# Patient Record
Sex: Female | Born: 1955 | Race: White | Hispanic: No | Marital: Married | Smoking: Never smoker
Health system: Southern US, Community
[De-identification: ages and names within clinical notes are randomized; demographics above are authoritative.]

## PROBLEM LIST (undated history)

## (undated) DIAGNOSIS — E785 Hyperlipidemia, unspecified: Secondary | ICD-10-CM

## (undated) DIAGNOSIS — R112 Nausea with vomiting, unspecified: Secondary | ICD-10-CM

## (undated) DIAGNOSIS — K219 Gastro-esophageal reflux disease without esophagitis: Secondary | ICD-10-CM

## (undated) DIAGNOSIS — N189 Chronic kidney disease, unspecified: Secondary | ICD-10-CM

## (undated) DIAGNOSIS — Z9889 Other specified postprocedural states: Secondary | ICD-10-CM

## (undated) DIAGNOSIS — F419 Anxiety disorder, unspecified: Secondary | ICD-10-CM

## (undated) DIAGNOSIS — K589 Irritable bowel syndrome without diarrhea: Secondary | ICD-10-CM

## (undated) DIAGNOSIS — K602 Anal fissure, unspecified: Secondary | ICD-10-CM

## (undated) HISTORY — DX: Irritable bowel syndrome without diarrhea: K58.9

## (undated) HISTORY — DX: Gastro-esophageal reflux disease without esophagitis: K21.9

## (undated) HISTORY — PX: LITHOTRIPSY: SUR834

## (undated) HISTORY — DX: Anal fissure, unspecified: K60.2

## (undated) HISTORY — DX: Hyperlipidemia, unspecified: E78.5

---

## 1997-04-09 ENCOUNTER — Ambulatory Visit (HOSPITAL_COMMUNITY): Admission: RE | Admit: 1997-04-09 | Discharge: 1997-04-09 | Payer: Self-pay | Admitting: Obstetrics & Gynecology

## 1998-05-07 ENCOUNTER — Encounter: Payer: Self-pay | Admitting: Obstetrics & Gynecology

## 1998-05-07 ENCOUNTER — Ambulatory Visit (HOSPITAL_COMMUNITY): Admission: RE | Admit: 1998-05-07 | Discharge: 1998-05-07 | Payer: Self-pay | Admitting: Obstetrics & Gynecology

## 1998-10-21 ENCOUNTER — Encounter: Payer: Self-pay | Admitting: Urology

## 1998-10-21 ENCOUNTER — Ambulatory Visit (HOSPITAL_COMMUNITY): Admission: RE | Admit: 1998-10-21 | Discharge: 1998-10-21 | Payer: Self-pay | Admitting: Urology

## 1999-05-12 ENCOUNTER — Encounter: Payer: Self-pay | Admitting: Obstetrics & Gynecology

## 1999-05-12 ENCOUNTER — Encounter: Admission: RE | Admit: 1999-05-12 | Discharge: 1999-05-12 | Payer: Self-pay | Admitting: Obstetrics & Gynecology

## 1999-08-20 ENCOUNTER — Other Ambulatory Visit: Admission: RE | Admit: 1999-08-20 | Discharge: 1999-08-20 | Payer: Self-pay | Admitting: Obstetrics & Gynecology

## 2000-05-17 ENCOUNTER — Encounter: Payer: Self-pay | Admitting: Obstetrics & Gynecology

## 2000-05-17 ENCOUNTER — Encounter: Admission: RE | Admit: 2000-05-17 | Discharge: 2000-05-17 | Payer: Self-pay | Admitting: Obstetrics & Gynecology

## 2001-05-23 ENCOUNTER — Encounter: Payer: Self-pay | Admitting: Obstetrics & Gynecology

## 2001-05-23 ENCOUNTER — Encounter: Admission: RE | Admit: 2001-05-23 | Discharge: 2001-05-23 | Payer: Self-pay | Admitting: Obstetrics & Gynecology

## 2001-05-30 ENCOUNTER — Encounter: Admission: RE | Admit: 2001-05-30 | Discharge: 2001-05-30 | Payer: Self-pay | Admitting: Obstetrics & Gynecology

## 2001-05-30 ENCOUNTER — Encounter: Payer: Self-pay | Admitting: Obstetrics & Gynecology

## 2002-06-07 ENCOUNTER — Other Ambulatory Visit: Admission: RE | Admit: 2002-06-07 | Discharge: 2002-06-07 | Payer: Self-pay | Admitting: Obstetrics & Gynecology

## 2002-06-14 ENCOUNTER — Encounter: Payer: Self-pay | Admitting: Obstetrics & Gynecology

## 2002-06-14 ENCOUNTER — Encounter: Admission: RE | Admit: 2002-06-14 | Discharge: 2002-06-14 | Payer: Self-pay | Admitting: Obstetrics & Gynecology

## 2003-01-27 HISTORY — PX: ABDOMINAL HYSTERECTOMY: SHX81

## 2003-03-28 ENCOUNTER — Ambulatory Visit (HOSPITAL_COMMUNITY): Admission: RE | Admit: 2003-03-28 | Discharge: 2003-03-28 | Payer: Self-pay | Admitting: Obstetrics & Gynecology

## 2003-05-07 ENCOUNTER — Ambulatory Visit (HOSPITAL_COMMUNITY): Admission: RE | Admit: 2003-05-07 | Discharge: 2003-05-07 | Payer: Self-pay | Admitting: Obstetrics & Gynecology

## 2003-05-29 ENCOUNTER — Encounter (INDEPENDENT_AMBULATORY_CARE_PROVIDER_SITE_OTHER): Payer: Self-pay | Admitting: Specialist

## 2003-05-29 ENCOUNTER — Inpatient Hospital Stay (HOSPITAL_COMMUNITY): Admission: RE | Admit: 2003-05-29 | Discharge: 2003-05-31 | Payer: Self-pay | Admitting: Obstetrics & Gynecology

## 2003-07-12 ENCOUNTER — Encounter: Admission: RE | Admit: 2003-07-12 | Discharge: 2003-07-12 | Payer: Self-pay | Admitting: Obstetrics & Gynecology

## 2003-07-16 ENCOUNTER — Encounter: Admission: RE | Admit: 2003-07-16 | Discharge: 2003-07-16 | Payer: Self-pay | Admitting: Obstetrics & Gynecology

## 2004-07-14 ENCOUNTER — Encounter: Admission: RE | Admit: 2004-07-14 | Discharge: 2004-07-14 | Payer: Self-pay | Admitting: Obstetrics & Gynecology

## 2005-07-28 ENCOUNTER — Encounter: Admission: RE | Admit: 2005-07-28 | Discharge: 2005-07-28 | Payer: Self-pay | Admitting: Family Medicine

## 2006-08-23 ENCOUNTER — Encounter: Admission: RE | Admit: 2006-08-23 | Discharge: 2006-08-23 | Payer: Self-pay | Admitting: Family Medicine

## 2006-10-07 ENCOUNTER — Encounter: Admission: RE | Admit: 2006-10-07 | Discharge: 2006-10-07 | Payer: Self-pay | Admitting: Urology

## 2007-08-30 ENCOUNTER — Encounter: Admission: RE | Admit: 2007-08-30 | Discharge: 2007-08-30 | Payer: Self-pay | Admitting: Family Medicine

## 2008-09-03 ENCOUNTER — Encounter: Admission: RE | Admit: 2008-09-03 | Discharge: 2008-09-03 | Payer: Self-pay | Admitting: Family Medicine

## 2009-09-06 ENCOUNTER — Encounter: Admission: RE | Admit: 2009-09-06 | Discharge: 2009-09-06 | Payer: Self-pay | Admitting: Family Medicine

## 2010-02-16 ENCOUNTER — Encounter: Payer: Self-pay | Admitting: Obstetrics & Gynecology

## 2010-06-13 NOTE — Op Note (Signed)
NAME:  Samantha Lynn, Samantha Lynn                          ACCOUNT NO.:  [id number]   MEDICAL RECORD NO.:  [id number]                   PATIENT TYPE:  INP   LOCATION:  9315                                 FACILITY:  [facility]   PHYSICIAN:  Gerrit Friends. Aldona Bar, M.D.                DATE OF BIRTH:  1955-11-08   DATE OF PROCEDURE:  05/29/2003  DATE OF DISCHARGE:                                 OPERATIVE REPORT   PREOPERATIVE DIAGNOSIS:  Suspected endometriosis.   POSTOPERATIVE DIAGNOSIS:  Endometriosis.   PROCEDURES:  1. Total abdominal hysterectomy, bilateral salpingo-oophorectomy.  2. Lysis of adhesions involving the ovaries and the cul-de-sac.   SURGEON:  Gerrit Friends. Aldona Bar, M.D.   ASSISTANT:  Miguel Aschoff, M.D.   ANESTHESIA:  General endotracheal.   HISTORY:  This 55 year old gravida 0 is admitted for a total abdominal  hysterectomy and bilateral salpingo-oophorectomy with a strong suspicion of  endometriosis.  She has symptoms consistent with endometriosis for a period  of time before, during and after her menses.  In February 2005 she actually  had what was felt to be a ruptured endometrioma.  She is taken to the  operating room now for a TAH-BSO and whatever else is felt to be clinically  indicated.   PROCEDURE:  The patient was taken to the operating room.  After the  satisfactory induction of a general endotracheal anesthetic, she was prepped  and draped and placed in the supine position with a Foley catheter inserted  as part of the prep.   At this time after she was adequately draped, the procedure was begun.  A  Pfannenstiel incision was made, with minimal difficulty dissected down  sharply to and through the fascia in a low transverse fashion with  hemostasis created at each layer.  Subfascial space was created inferiorly  and superiorly, muscles separated in the midline, peritoneum identified and  entered appropriately with care taken to avoid the bladder inferiorly and  the bowel  superiorly.  At this time the abdomen and pelvis were inspected.  The ovaries were noted to be bound down in the cul-de-sac and beginning in  the midline, they were bluntly dissected free.  Both ovaries were slightly  enlarged with endometriotic implants over the surface of the ovaries.  Once  the ovaries had been freed up, the uterus was freed up as well.  The uterus  was upper limits of normal size and slightly irregular, consistent with some  intramural myomas.  At this time the bowel was packed off after a self-  retaining O'Connor-O'Sullivan retractor was placed.  Hysterectomy at this  time was begun in the usual fashion.  Long Kelly clamps were placed at the  corners of the uterus and the round ligaments at this time were clamped,  suture-secured, and opened with the Bovie, and the bladder flap likewise  dissected anteriorly.  At this time as had been previously discussed,  the  ureters were identified bilaterally and the infundibulopelvic pedicles were  isolated and clamped, cut, and doubly tied, thus removing both tubes and  ovaries with the specimen.  At this time with the bladder pushed down  anteriorly and the ureters again identified, the uterine artery pedicles  were clamped, cut, and suture-secured with 0 Vicryl suture.  Additional  parametrial bites were taken in a similar fashion bilaterally down to the  level of the vagina.  At that juncture a curved Heaney clamp was applied  bilaterally and the pedicle transfixed and the vaginal angles were entered  laterally.  Using the Satinsky scissors, the cervix was then removed in its  entirety.  The vaginal angles were again transfixed appropriately and  closure of the vaginal cuff was carried out with 0 Vicryl in a figure-of-  eight interrupted fashion.  At this time the pelvis was profusely irrigated  and noted to be dry.  Again identification of the ureters found them to be  normal in their course.  At this time although there were  some endometrial  implants in the cul-de-sac area, the procedure was felt to be complete.  The  packs were removed.  The appendix was identified and was noted to be  retrocecal and totally normal and was left in situ.  At this time the  retractor was removed and after all counts were noted to be correct and no  foreign bodies were noted to be remaining in the abdominal cavity, closure  of the abdomen was begun in layers.  The abdominal peritoneum was closed  with 0 Vicryl in a running fashion and muscle secured with same.  At this  time after good subfascial hemostasis was noted, the fascia was  reapproximated using 0 Vicryl from angle to midline bilaterally.  Subcutaneous tissue was then rendered hemostatic and staples were then used  to close the skin.  A sterile pressure dressing was applied and the patient  at this time was transported to the recovery room in satisfactory condition,  having tolerated the procedure well.  Estimated blood loss 150 mL.  All  counts correct x2.  Pathologic specimen included the uterus, tubes, and  ovaries submitted as one specimen.   POSTOPERATIVE DIAGNOSIS:  Endometriosis.   All counts correct x2.                                               Gerrit Friends. Aldona Bar, M.D.    RMW/MEDQ  D:  05/29/2003  T:  05/29/2003  Job:  [id number]

## 2010-06-13 NOTE — Discharge Summary (Signed)
NAME:  Samantha Lynn, Samantha Lynn                          ACCOUNT NO.:  [id number]   MEDICAL RECORD NO.:  [id number]                   PATIENT TYPE:  INP   LOCATION:  9315                                 FACILITY:  [facility]   PHYSICIAN:  Gerrit Friends. Aldona Bar, M.D.                DATE OF BIRTH:  1955/03/29   DATE OF ADMISSION:  05/29/2003  DATE OF DISCHARGE:  05/31/2003                                 DISCHARGE SUMMARY   DISCHARGE DIAGNOSES:  1. Endometriosis.  2. Left ovarian mucinous cystadenoma.  3. Pelvic adhesions.  4. Benign endocervical polyp.  5. Leiomyomatous uterus.   PROCEDURE:  Total abdominal hysterectomy, bilateral salpingo-oophorectomy,  and lysis of pelvic adhesions.   SUMMARY:  This 55 year old female was admitted with symptoms very consistent  with endometriosis and on the day of admission, after a normal preoperative  workup, underwent a total abdominal hysterectomy, bilateral salpingo-  oophorectomy, and lysis of adhesions. Postoperatively, she did well. She  remained afebrile during her hospital course. She had return of bowel  function as anticipated, eventually ambulated, and tolerated a regular diet  without difficulty. Her pain was controlled with oral pain medications and  on the afternoon of May 31, 2003, was deemed ready for discharge and  accordingly was discharged to home after her staples were removed and her  wound had Steri-Strips and Benzoin. Pathologic report consistent with  discharge diagnoses.   Discharge hemoglobin 9.8, white count 6200, and platelet count 223,000.   On the afternoon of May 31, 2003, she was given all appropriate instructions  and discharged. Discharge medications include Darvocet N-100 one q.4h.  p.r.n. pain, Motrin 600 mg one q.6h. p.r.n. pain, Vivelle-Dot 0.075 mg as  directed, and Dalmane 30 mg one as needed at bedtime for sleep. She was  given all specific instructions at the time of discharge and understood all  instructions well. She will  return to the office for follow-up in  approximately four weeks time or as needed.   CONDITION ON DISCHARGE:  Improved.                                               Gerrit Friends. Aldona Bar, M.D.    RMW/MEDQ  D:  05/31/2003  T:  06/01/2003  Job:  [id number]

## 2010-06-13 NOTE — H&P (Signed)
NAME:  Samantha Lynn, Samantha Lynn                          ACCOUNT NO.:  [id number]   MEDICAL RECORD NO.:  [id number]                   PATIENT TYPE:  INP   LOCATION:  NA                                   [facility]   PHYSICIAN:  Gerrit Friends. Aldona Bar, M.D.                DATE OF BIRTH:  Jul 09, 1955   DATE OF ADMISSION:  DATE OF DISCHARGE:                                HISTORY & PHYSICAL   DATE OF SURGERY:  Tuesday, May 29, 2003   HISTORY:  This patient is a 55 year old gravida 0 white female admitted for  a total abdominal hysterectomy and bilateral salpingo-oophorectomy with a  strong suspicion of endometriosis.  She has been having some symptoms  consistent with endometriosis for a period of time - mostly pelvic  discomfort associated with, before, and after her menses.  In February 2005  she presented to her family doctor in [place] for evaluation of abdominal  pain and a CT was done which revealed a moderate amount of free fluid in the  pelvis associated with some ovarian cysts and uterine fibroids which were  known.  She was seen by me not too long after and it was felt that she  probably had a ruptured cyst, possibly a ruptured endometrioma, but her  symptoms were better.  Her evaluation included a CA125 which was 45  (slightly elevated) and an ultrasound which was done at [facility]  revealing a retroverted uterus with at least two myomas, the largest of  which measured 4 cm.  There was a complex cystic area in the right adnexa  measuring 3.3 cm which was consistent with either a hemorrhagic ovarian cyst  or endometrioma, and a small amount of free fluid seen in the right adnexa.  There was also a small hemorrhagic cyst noted on the left ovary measuring 2  x 2 cm.  A follow-up CA125 was done in mid April which was 25.9 (normal) and  a follow-up ultrasound was likewise done which revealed a persistent but  decreased size of the complex right ovarian cyst and some surrounding  complex  right ovarian fluid.  The cyst on the left had increased a small  amount in size since the previous ultrasound.  The fibroids were stable.  In  talking with the patient at length it was her feeling that this discomfort  was persistent - coming and going, not inconsistent with the diagnosis of  endometriosis which had been discussed with her in the past many times.  She  was now desirous of surgical intervention and correction and relief of her  problem.  Total abdominal hysterectomy and bilateral salpingo-oophorectomy  was discussed with the patient and decided upon as a diagnostic and  therapeutic maneuver.  She is being taken to the operating room now for such  procedure.   PAST MEDICAL HISTORY:  The patient is only allergic to ADHESIVE TAPE.  She  is allergic to no medications.  She is a nonsmoker.  She has had no previous  operative procedures.  She is currently on no medications.  She does use  over-the-counter antacids for what she thinks might be a gallbladder problem  which will be followed up by her appropriately postoperatively.  She was  given the option of working this up also preoperatively but declined.   SOCIAL HISTORY AND FAMILY HISTORY:  Noncontributory.   REVIEW OF SYSTEMS:  Negative with the exception of above.   The patient's last menstrual period was April 20 and was normal for her.  Her last cervical cytology was in May 2004 and was normal as have been all  her previous Pap smears.  Her last mammography was likewise in May 2004 and  was within normal limits as has been all of her previous mammograms.   PHYSICAL EXAMINATION AT THE TIME OF ADMISSION:  GENERAL:  Finds a well-  developed white female in no distress.  VITAL SIGNS:  Weight 130, height 5 feet 4 inches, blood pressure 126/80,  temperature 98.2, respirations 16 and regular, pulse 80 and regular.  HEENT:  Negative, thyroid not enlarged.  CHEST:  Clear to auscultation and percussion.  CARDIOVASCULAR:   Regular rhythm, no murmur.  BREAST:  Negative.  ABDOMEN:  Unremarkable.  No abdominal masses are felt.  Abdomen is soft and  nontender.  PELVIC:  The uterus is upper limits of normal size, slightly mobile, mid to  posterior in position.  There is some thickening in the right adnexa.  The  left adnexa is unremarkable. Rectovaginal exam confirmatory.  EXTREMITIES:  Negative.  NEUROLOGIC:  Physiologic.   IMPRESSION:  Probable endometriosis.   PLAN:  The patient will undergo a total abdominal hysterectomy and bilateral  salpingo-oophorectomy with estrogen replacement postoperatively.  The  diagnosis of endometriosis has been discussed with her and although it is a  possible diagnosis she wishes to proceed with this procedure since most of  her symptomatology seems to be related to pelvic pathology.                                               Gerrit Friends. Aldona Bar, M.D.    RMW/MEDQ  D:  05/24/2003  T:  05/24/2003  Job:  [id number]   cc:   Admitting area at [facility]

## 2010-08-29 ENCOUNTER — Other Ambulatory Visit: Payer: Self-pay | Admitting: Family Medicine

## 2010-08-29 DIAGNOSIS — Z1231 Encounter for screening mammogram for malignant neoplasm of breast: Secondary | ICD-10-CM

## 2010-09-09 ENCOUNTER — Ambulatory Visit
Admission: RE | Admit: 2010-09-09 | Discharge: 2010-09-09 | Disposition: A | Discharge status: Home / Self Care | Payer: Managed Care, Other (non HMO) | Source: Ambulatory Visit | Attending: Family Medicine | Admitting: Family Medicine

## 2010-09-09 DIAGNOSIS — Z1231 Encounter for screening mammogram for malignant neoplasm of breast: Secondary | ICD-10-CM

## 2011-09-04 ENCOUNTER — Other Ambulatory Visit: Payer: Self-pay | Admitting: Family Medicine

## 2011-09-04 DIAGNOSIS — Z1231 Encounter for screening mammogram for malignant neoplasm of breast: Secondary | ICD-10-CM

## 2011-09-18 ENCOUNTER — Ambulatory Visit
Admission: RE | Admit: 2011-09-18 | Discharge: 2011-09-18 | Disposition: A | Discharge status: Home / Self Care | Payer: Managed Care, Other (non HMO) | Source: Ambulatory Visit | Attending: Family Medicine | Admitting: Family Medicine

## 2011-09-18 DIAGNOSIS — Z1231 Encounter for screening mammogram for malignant neoplasm of breast: Secondary | ICD-10-CM

## 2011-09-21 ENCOUNTER — Other Ambulatory Visit: Payer: Self-pay | Admitting: Family Medicine

## 2011-09-21 DIAGNOSIS — N63 Unspecified lump in unspecified breast: Secondary | ICD-10-CM

## 2011-09-21 DIAGNOSIS — N6310 Unspecified lump in the right breast, unspecified quadrant: Secondary | ICD-10-CM

## 2011-09-23 ENCOUNTER — Ambulatory Visit
Admission: RE | Admit: 2011-09-23 | Discharge: 2011-09-23 | Disposition: A | Discharge status: Home / Self Care | Payer: Managed Care, Other (non HMO) | Source: Ambulatory Visit | Attending: Family Medicine | Admitting: Family Medicine

## 2011-09-23 DIAGNOSIS — N6310 Unspecified lump in the right breast, unspecified quadrant: Secondary | ICD-10-CM

## 2011-09-23 DIAGNOSIS — N63 Unspecified lump in unspecified breast: Secondary | ICD-10-CM

## 2011-11-11 ENCOUNTER — Encounter (INDEPENDENT_AMBULATORY_CARE_PROVIDER_SITE_OTHER): Payer: Managed Care, Other (non HMO) | Admitting: Ophthalmology

## 2011-11-11 DIAGNOSIS — H2510 Age-related nuclear cataract, unspecified eye: Secondary | ICD-10-CM

## 2011-11-11 DIAGNOSIS — H3530 Unspecified macular degeneration: Secondary | ICD-10-CM

## 2011-11-11 DIAGNOSIS — H43819 Vitreous degeneration, unspecified eye: Secondary | ICD-10-CM

## 2011-12-07 ENCOUNTER — Encounter (INDEPENDENT_AMBULATORY_CARE_PROVIDER_SITE_OTHER): Payer: Managed Care, Other (non HMO) | Admitting: Ophthalmology

## 2011-12-07 DIAGNOSIS — H43819 Vitreous degeneration, unspecified eye: Secondary | ICD-10-CM

## 2011-12-07 DIAGNOSIS — H2510 Age-related nuclear cataract, unspecified eye: Secondary | ICD-10-CM

## 2011-12-07 DIAGNOSIS — H3530 Unspecified macular degeneration: Secondary | ICD-10-CM

## 2012-03-15 ENCOUNTER — Encounter (INDEPENDENT_AMBULATORY_CARE_PROVIDER_SITE_OTHER): Payer: Managed Care, Other (non HMO) | Admitting: Ophthalmology

## 2012-03-18 ENCOUNTER — Encounter (INDEPENDENT_AMBULATORY_CARE_PROVIDER_SITE_OTHER): Payer: Managed Care, Other (non HMO) | Admitting: Ophthalmology

## 2012-03-18 DIAGNOSIS — H43819 Vitreous degeneration, unspecified eye: Secondary | ICD-10-CM

## 2012-03-18 DIAGNOSIS — H3530 Unspecified macular degeneration: Secondary | ICD-10-CM

## 2012-09-01 ENCOUNTER — Ambulatory Visit (INDEPENDENT_AMBULATORY_CARE_PROVIDER_SITE_OTHER): Payer: Self-pay | Admitting: Ophthalmology

## 2012-09-12 ENCOUNTER — Ambulatory Visit (INDEPENDENT_AMBULATORY_CARE_PROVIDER_SITE_OTHER): Payer: 59 | Admitting: Ophthalmology

## 2012-09-12 DIAGNOSIS — H43819 Vitreous degeneration, unspecified eye: Secondary | ICD-10-CM

## 2012-09-12 DIAGNOSIS — H3530 Unspecified macular degeneration: Secondary | ICD-10-CM

## 2012-09-12 DIAGNOSIS — H2510 Age-related nuclear cataract, unspecified eye: Secondary | ICD-10-CM

## 2012-09-14 ENCOUNTER — Other Ambulatory Visit: Payer: Self-pay

## 2012-09-14 DIAGNOSIS — Z1231 Encounter for screening mammogram for malignant neoplasm of breast: Secondary | ICD-10-CM

## 2012-09-16 ENCOUNTER — Ambulatory Visit (INDEPENDENT_AMBULATORY_CARE_PROVIDER_SITE_OTHER): Payer: Managed Care, Other (non HMO) | Admitting: Ophthalmology

## 2012-10-03 ENCOUNTER — Ambulatory Visit
Admission: RE | Admit: 2012-10-03 | Discharge: 2012-10-03 | Disposition: A | Discharge status: Home / Self Care | Payer: Commercial Indemnity | Source: Ambulatory Visit

## 2012-10-03 DIAGNOSIS — Z1231 Encounter for screening mammogram for malignant neoplasm of breast: Secondary | ICD-10-CM

## 2013-01-09 ENCOUNTER — Other Ambulatory Visit: Payer: Self-pay | Admitting: Dermatology

## 2013-09-05 ENCOUNTER — Other Ambulatory Visit: Payer: Self-pay

## 2013-09-05 DIAGNOSIS — Z1231 Encounter for screening mammogram for malignant neoplasm of breast: Secondary | ICD-10-CM

## 2013-10-04 ENCOUNTER — Ambulatory Visit
Admission: RE | Admit: 2013-10-04 | Discharge: 2013-10-04 | Disposition: A | Discharge status: Home / Self Care | Payer: Managed Care, Other (non HMO) | Source: Ambulatory Visit

## 2013-10-04 DIAGNOSIS — Z1231 Encounter for screening mammogram for malignant neoplasm of breast: Secondary | ICD-10-CM

## 2013-10-16 ENCOUNTER — Ambulatory Visit (INDEPENDENT_AMBULATORY_CARE_PROVIDER_SITE_OTHER): Payer: 59 | Admitting: Ophthalmology

## 2014-02-05 ENCOUNTER — Other Ambulatory Visit: Payer: Self-pay | Admitting: Dermatology

## 2014-09-12 ENCOUNTER — Other Ambulatory Visit: Payer: Self-pay

## 2014-09-12 DIAGNOSIS — Z1231 Encounter for screening mammogram for malignant neoplasm of breast: Secondary | ICD-10-CM

## 2014-10-09 ENCOUNTER — Ambulatory Visit: Payer: Managed Care, Other (non HMO)

## 2014-10-10 ENCOUNTER — Ambulatory Visit
Admission: RE | Admit: 2014-10-10 | Discharge: 2014-10-10 | Disposition: A | Discharge status: Home / Self Care | Payer: BLUE CROSS/BLUE SHIELD | Source: Ambulatory Visit

## 2014-10-10 DIAGNOSIS — Z1231 Encounter for screening mammogram for malignant neoplasm of breast: Secondary | ICD-10-CM

## 2015-02-27 ENCOUNTER — Other Ambulatory Visit: Payer: Self-pay | Admitting: Urology

## 2015-03-18 ENCOUNTER — Other Ambulatory Visit: Payer: Self-pay | Admitting: Urology

## 2015-04-02 ENCOUNTER — Encounter (HOSPITAL_COMMUNITY): Payer: Self-pay | Admitting: General Practice

## 2015-04-04 ENCOUNTER — Encounter (HOSPITAL_COMMUNITY): Payer: Self-pay | Admitting: *Deleted

## 2015-04-04 ENCOUNTER — Ambulatory Visit (HOSPITAL_COMMUNITY)
Admission: RE | Admit: 2015-04-04 | Discharge: 2015-04-04 | Disposition: A | Discharge status: Home / Self Care | Payer: BLUE CROSS/BLUE SHIELD | Source: Ambulatory Visit | Attending: Urology | Admitting: Urology

## 2015-04-04 ENCOUNTER — Encounter (HOSPITAL_COMMUNITY)
Admission: RE | Disposition: A | Discharge status: Home / Self Care | Payer: Self-pay | Source: Ambulatory Visit | Attending: Urology

## 2015-04-04 ENCOUNTER — Ambulatory Visit (HOSPITAL_COMMUNITY): Payer: BLUE CROSS/BLUE SHIELD

## 2015-04-04 DIAGNOSIS — Z87442 Personal history of urinary calculi: Secondary | ICD-10-CM | POA: Diagnosis not present

## 2015-04-04 DIAGNOSIS — Z79899 Other long term (current) drug therapy: Secondary | ICD-10-CM | POA: Insufficient documentation

## 2015-04-04 DIAGNOSIS — M1990 Unspecified osteoarthritis, unspecified site: Secondary | ICD-10-CM | POA: Diagnosis not present

## 2015-04-04 DIAGNOSIS — F419 Anxiety disorder, unspecified: Secondary | ICD-10-CM | POA: Diagnosis not present

## 2015-04-04 DIAGNOSIS — Z841 Family history of disorders of kidney and ureter: Secondary | ICD-10-CM | POA: Insufficient documentation

## 2015-04-04 DIAGNOSIS — Z7982 Long term (current) use of aspirin: Secondary | ICD-10-CM | POA: Insufficient documentation

## 2015-04-04 DIAGNOSIS — R109 Unspecified abdominal pain: Secondary | ICD-10-CM | POA: Diagnosis present

## 2015-04-04 DIAGNOSIS — Z79891 Long term (current) use of opiate analgesic: Secondary | ICD-10-CM | POA: Diagnosis not present

## 2015-04-04 DIAGNOSIS — N200 Calculus of kidney: Secondary | ICD-10-CM

## 2015-04-04 HISTORY — DX: Anxiety disorder, unspecified: F41.9

## 2015-04-04 HISTORY — DX: Nausea with vomiting, unspecified: R11.2

## 2015-04-04 HISTORY — DX: Chronic kidney disease, unspecified: N18.9

## 2015-04-04 HISTORY — DX: Other specified postprocedural states: Z98.890

## 2015-04-04 SURGERY — LITHOTRIPSY, ESWL
Anesthesia: LOCAL | Laterality: Left

## 2015-04-04 MED ORDER — DIPHENHYDRAMINE HCL 25 MG PO CAPS: 25.0000 mg | ORAL_CAPSULE | ORAL | Status: DC

## 2015-04-04 MED ORDER — SODIUM CHLORIDE 0.9 % IV SOLN
INTRAVENOUS | Status: DC
  Administered 2015-04-04: 10:00:00 via INTRAVENOUS

## 2015-04-04 MED ORDER — LEVOFLOXACIN 500 MG PO TABS
500.0000 mg | ORAL_TABLET | ORAL | Status: AC
  Administered 2015-04-04: 500 mg via ORAL
  Filled 2015-04-04: qty 1

## 2015-04-04 MED ORDER — DIAZEPAM 5 MG PO TABS
10.0000 mg | ORAL_TABLET | ORAL | Status: AC
  Administered 2015-04-04: 10 mg via ORAL
  Filled 2015-04-04: qty 2

## 2015-04-04 NOTE — Discharge Instructions (Signed)
Dietary Guidelines to Help Prevent Kidney Stones Your risk of kidney stones can be decreased by adjusting the foods you eat. The most important thing you can do is drink enough fluid. You should drink enough fluid to keep your urine clear or pale yellow. The following guidelines provide specific information for the type of kidney stone you have had. GUIDELINES ACCORDING TO TYPE OF KIDNEY STONE Calcium Oxalate Kidney Stones  Reduce the amount of salt you eat. Foods that have a lot of salt cause your body to release excess calcium into your urine. The excess calcium can combine with a substance called oxalate to form kidney stones.  Reduce the amount of animal protein you eat if the amount you eat is excessive. Animal protein causes your body to release excess calcium into your urine. Ask your dietitian how much protein from animal sources you should be eating.  Avoid foods that are high in oxalates. If you take vitamins, they should have less than 500 mg of vitamin C. Your body turns vitamin C into oxalates. You do not need to avoid fruits and vegetables high in vitamin C. Calcium Phosphate Kidney Stones  Reduce the amount of salt you eat to help prevent the release of excess calcium into your urine.  Reduce the amount of animal protein you eat if the amount you eat is excessive. Animal protein causes your body to release excess calcium into your urine. Ask your dietitian how much protein from animal sources you should be eating.  Get enough calcium from food or take a calcium supplement (ask your dietitian for recommendations). Food sources of calcium that do not increase your risk of kidney stones include:  Broccoli.  Dairy products, such as cheese and yogurt.  Pudding. Uric Acid Kidney Stones  Do not have more than 6 oz of animal protein per day. FOOD SOURCES Animal Protein Sources  Meat (all types).  Poultry.  Eggs.  Fish, seafood. Foods High in Salt  Salt seasonings.  Soy  sauce.  Teriyaki sauce.  Cured and processed meats.  Salted crackers and snack foods.  Fast food.  Canned soups and most canned foods. Foods High in Oxalates  Grains:  Amaranth.  Barley.  Grits.  Wheat germ.  Bran.  Buckwheat flour.  All bran cereals.  Pretzels.  Whole wheat bread.  Vegetables:  Beans (wax).  Beets and beet greens.  Collard greens.  Eggplant.  Escarole.  Leeks.  Okra.  Parsley.  Rutabagas.  Spinach.  Swiss chard.  Tomato paste.  Fried potatoes.  Sweet potatoes.  Fruits:  Red currants.  Figs.  Kiwi.  Rhubarb.  Meat and Other Protein Sources:  Beans (dried).  Soy burgers and other soybean products.  Miso.  Nuts (peanuts, almonds, pecans, cashews, hazelnuts).  Nut butters.  Sesame seeds and tahini (paste made of sesame seeds).  Poppy seeds.  Beverages:  Chocolate drink mixes.  Soy milk.  Instant iced tea.  Juices made from high-oxalate fruits or vegetables.  Other:  Carob.  Chocolate.  Fruitcake.  Marmalades.   This information is not intended to replace advice given to you by your health care provider. Make sure you discuss any questions you have with your health care provider.   Document Released: 05/09/2010 Document Revised: 01/17/2013 Document Reviewed: 12/09/2012 Elsevier Interactive Patient Education 2016 [employer] Inc[employer]  

## 2015-04-04 NOTE — Interval H&P Note (Signed)
History and Physical Interval Note:  04/04/2015 10:46 AM  Samantha Lynn  has presented today for surgery, with the diagnosis of LEFT RENAL STONE  The various methods of treatment have been discussed with the patient and family. After consideration of risks, benefits and other options for treatment, the patient has consented to  Procedure(s): LEFT EXTRACORPOREAL SHOCK WAVE LITHOTRIPSY (ESWL) (Left) as a surgical intervention .  The patient's history has been reviewed, patient examined, no change in status, stable for surgery.  I have reviewed the patient's chart and labs.  Questions were answered to the patient's satisfaction.     Nataliee Shurtz I Brandell Maready

## 2015-04-04 NOTE — H&P (Signed)
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 She comes in today with intermittent left flank/back pain, most recently a week ago. She has persistent pain on that side. She does have some dark urine at times as well. No right-sided symptoms. Last seen here in September, 2016 with a 9 x 7 mm left renal pelvic stone, seen by Jetta Loutiane Warden, NP. This was felt to be moving around , perhaps. She has no nausea, vomiting, fever or chills. She is having no red urine. There is no right-sided pain.   Past Medical History Problems  1. History of Anxiety (F41.9) 2. History of arthritis (Z87.39) 3. History of heartburn (Z87.898) 4. History of hypercholesterolemia (Z86.39) 5. History of Murmur (R01.1)  Surgical History Problems  1. History of Hysterectomy 2. History of Kidney Surgery  Current Meds 1. ALPRAZolam 0.25 MG Oral Tablet;  Therapy: (Recorded:10Jan2012) to Recorded 2. Aspirin 81 MG TABS;  Therapy: (Recorded:10Jan2012) to Recorded 3. Eye Vitamins CAPS;  Therapy: (Recorded:18Apr2016) to Recorded 4. Fish Oil CAPS; 1000mg ;  Therapy: (Recorded:10Jan2012) to Recorded 5. OxyCODONE HCl - 5 MG Oral Tablet; TAKE 1 TABLET EVERY 4 HOURS AS NEEDED  FOR PAIN;  Therapy: 18Apr2016 to (Evaluate:25Apr2016); Last Rx:18Apr2016 Ordered 6. Vitamin B-12 1000 MCG Oral Tablet;  Therapy: (Recorded:18Apr2016) to Recorded 7. Vitamin C 1000 MG Oral Tablet;  Therapy: (Recorded:18Apr2016) to Recorded 8. Vitamin D 1000 UNIT Oral Tablet;  Therapy: (Recorded:18Apr2016) to Recorded  Allergies Medication  1. No Known Drug Allergies  Family History Problems  1. Family history of diabetes mellitus (Z83.3) : Mother 2. Family history of hypercholesterolemia (Z83.42) : Mother, Father 3. Family history of hypertension (Z82.49) : Mother, Father 4. Family history of kidney stones (Z84.1) : Father 5. Family history of renal failure (Z84.1) : Father 6. Family history of Hematuria, microscopic :  Father  Social History Problems  1. Denied: History of Alcohol Use (History) 2. Denied: History of Caffeine Use 3. Father deceased 404. Married 5. Never a smoker 6. Retired   retired Interior and spatial designerhairdresser 7. Denied: History of Tobacco Use  Review of Systems Genitourinary, constitutional, skin, eye, otolaryngeal, hematologic/lymphatic, cardiovascular, pulmonary, endocrine, musculoskeletal, gastrointestinal, neurological and psychiatric system(s) were reviewed and pertinent findings if present are noted and are otherwise negative.  Genitourinary: hematuria.  Gastrointestinal: flank pain, but no nausea and no vomiting.  Musculoskeletal: back pain.    Vitals Vital Signs [Data Includes: Last 1 Day]  Recorded: 30Jan2017 04:03PM  Height: 5 ft 4 in Weight: 138 lb  BMI Calculated: 23.69 BSA Calculated: 1.67 Blood Pressure: 155 / 72 Temperature: 98.7 F Heart Rate: 76  Physical Exam Constitutional: Well nourished and well developed . No acute distress.  ENT:. The ears and nose are normal in appearance.  Neck: The appearance of the neck is normal.  Pulmonary: No respiratory distress and normal respiratory rhythm and effort.  Abdomen: The abdomen is rounded. The abdomen is soft and nontender. No masses are palpated. No CVA tenderness. No hernias are palpable. No hepatosplenomegaly noted.  Skin: Normal skin turgor, no visible rash and no visible skin lesions.  Neuro/Psych:. Mood and affect are appropriate.    Results/Data Urine [Data Includes: Last 1 Day]   30Jan2017  COLOR YELLOW   APPEARANCE CLEAR   SPECIFIC GRAVITY 1.015   pH 6.0   GLUCOSE NEGATIVE   BILIRUBIN NEGATIVE   KETONE NEGATIVE   BLOOD NEGATIVE   PROTEIN NEGATIVE   NITRITE NEGATIVE   LEUKOCYTE ESTERASE NEGATIVE    Urinalysis is clear. KUB was  obtained. There is an 11 x 9 mm calcification in the interpolar region of the left kidney. I see no evidence of calcifications along the course of either ureter. Bowel gas pattern is  normal. Bony structures are normal except for mild osteopenia, generalized   Assessment Assessed  1. Kidney stone on left side (N20.0) 2. Left flank pain (R10.9)  Intermittently symptomatic 11 x 9 mm left renal pelvic stone-this appears to grown somewhat since her last visit here in September   Plan Health Maintenance  1. UA With REFLEX; [Do Not Release]; Status:Complete;   Done: 30Jan2017 03:35PM  Discussion/Summary I think it is worthwhile to consider treating the stone-we talked about the options of shockwave lithotripsy versus ureteroscopy with holmium laser and extraction of stones. We talked about the stone free rate for each of these treatments, and that stone free rate is slightly higher with ureteroscopy versus shockwave lithotripsy. However, we did discuss the more invasive nature of the ureteroscopy. We also talked about the possible necessity of placing a stent after the ureteroscopic procedure.    The patient will consider both of the above-she will call back at her leisure to get these scheduled.   Amendment Cc: Dr. Sudie Bailey, [place]   Signatures Electronically signed by : Marcine Matar, M.D.; Feb 25 2015  5:55PM EST

## 2015-04-05 ENCOUNTER — Emergency Department (HOSPITAL_COMMUNITY): Payer: BLUE CROSS/BLUE SHIELD

## 2015-04-05 ENCOUNTER — Encounter (HOSPITAL_COMMUNITY): Payer: Self-pay | Admitting: *Deleted

## 2015-04-05 ENCOUNTER — Emergency Department (HOSPITAL_COMMUNITY)
Admission: EM | Admit: 2015-04-05 | Discharge: 2015-04-05 | Disposition: A | Discharge status: Home / Self Care | Payer: BLUE CROSS/BLUE SHIELD | Attending: Emergency Medicine | Admitting: Emergency Medicine

## 2015-04-05 DIAGNOSIS — Z87442 Personal history of urinary calculi: Secondary | ICD-10-CM | POA: Diagnosis not present

## 2015-04-05 DIAGNOSIS — G8918 Other acute postprocedural pain: Secondary | ICD-10-CM

## 2015-04-05 DIAGNOSIS — Z7982 Long term (current) use of aspirin: Secondary | ICD-10-CM | POA: Insufficient documentation

## 2015-04-05 DIAGNOSIS — F419 Anxiety disorder, unspecified: Secondary | ICD-10-CM | POA: Insufficient documentation

## 2015-04-05 DIAGNOSIS — Z79899 Other long term (current) drug therapy: Secondary | ICD-10-CM | POA: Diagnosis not present

## 2015-04-05 DIAGNOSIS — R112 Nausea with vomiting, unspecified: Secondary | ICD-10-CM | POA: Insufficient documentation

## 2015-04-05 LAB — CBC WITH DIFFERENTIAL/PLATELET
BASOS ABS: 0 10*3/uL (ref 0.0–0.1)
Basophils Relative: 0 %
EOS ABS: 0 10*3/uL (ref 0.0–0.7)
EOS PCT: 0 %
HCT: 39.2 % (ref 36.0–46.0)
Hemoglobin: 13.7 g/dL (ref 12.0–15.0)
LYMPHS PCT: 10 %
Lymphs Abs: 0.9 10*3/uL (ref 0.7–4.0)
MCH: 29.8 pg (ref 26.0–34.0)
MCHC: 34.9 g/dL (ref 30.0–36.0)
MCV: 85.4 fL (ref 78.0–100.0)
Monocytes Absolute: 0.7 10*3/uL (ref 0.1–1.0)
Monocytes Relative: 8 %
Neutro Abs: 7 10*3/uL (ref 1.7–7.7)
Neutrophils Relative %: 82 %
Platelets: 197 10*3/uL (ref 150–400)
RBC: 4.59 MIL/uL (ref 3.87–5.11)
RDW: 12.6 % (ref 11.5–15.5)
WBC: 8.7 10*3/uL (ref 4.0–10.5)

## 2015-04-05 LAB — I-STAT CHEM 8, ED
BUN: 15 mg/dL (ref 6–20)
CALCIUM ION: 1.22 mmol/L (ref 1.12–1.23)
CREATININE: 0.8 mg/dL (ref 0.44–1.00)
Chloride: 100 mmol/L — ABNORMAL LOW (ref 101–111)
GLUCOSE: 124 mg/dL — AB (ref 65–99)
HCT: 42 % (ref 36.0–46.0)
HEMOGLOBIN: 14.3 g/dL (ref 12.0–15.0)
Potassium: 4 mmol/L (ref 3.5–5.1)
Sodium: 139 mmol/L (ref 135–145)
TCO2: 25 mmol/L (ref 0–100)

## 2015-04-05 LAB — URINALYSIS, ROUTINE W REFLEX MICROSCOPIC
Bilirubin Urine: NEGATIVE
GLUCOSE, UA: NEGATIVE mg/dL
KETONES UR: NEGATIVE mg/dL
LEUKOCYTES UA: NEGATIVE
Nitrite: NEGATIVE
PH: 6 (ref 5.0–8.0)
PROTEIN: NEGATIVE mg/dL
Specific Gravity, Urine: 1.004 — ABNORMAL LOW (ref 1.005–1.030)

## 2015-04-05 LAB — URINE MICROSCOPIC-ADD ON
Bacteria, UA: NONE SEEN
WBC, UA: NONE SEEN WBC/hpf (ref 0–5)

## 2015-04-05 MED ORDER — ONDANSETRON HCL 4 MG/2ML IJ SOLN
4.0000 mg | Freq: Once | INTRAMUSCULAR | Status: AC
  Administered 2015-04-05: 4 mg via INTRAVENOUS
  Filled 2015-04-05: qty 2

## 2015-04-05 MED ORDER — IBUPROFEN 800 MG PO TABS: 800.0000 mg | ORAL_TABLET | Freq: Three times a day (TID) | ORAL | Status: DC

## 2015-04-05 MED ORDER — KETOROLAC TROMETHAMINE 30 MG/ML IJ SOLN
30.0000 mg | Freq: Once | INTRAMUSCULAR | Status: AC
  Administered 2015-04-05: 30 mg via INTRAVENOUS
  Filled 2015-04-05: qty 1

## 2015-04-05 NOTE — ED Notes (Signed)
PT made aware of urine sample; pt states "I just peed"

## 2015-04-05 NOTE — ED Notes (Signed)
Pt states that she had lithotripsy 3/9 around 11am; pt states that she felt some discomfort when she went home but pain was tolerable; pt states that when she started to pass some of the stones she begna to have increased pain; pt states that she took an Oxycodone around 5:15 and then took the last one 11:30pm; pt states that she began to have nausea around the same time she took the pain medication; pt states that she spoke with the NP on call and she advised to take 3 ibuprofen and soak in a warm tub; pt states that she also took Zofran; pt states that she began vomiting around 1am; pt states that she has vomited x 1 but still has nausea; pt states that she was advised bu the NP that if she began vomiting to come to the ER

## 2015-04-05 NOTE — ED Provider Notes (Signed)
CSN: [id number]     Arrival date & time 04/05/15  0135 History   First MD Initiated Contact with Patient 04/05/15 0501     Chief Complaint  Patient presents with  . Post-op Problem     (Consider location/radiation/quality/duration/timing/severity/associated sxs/prior Treatment) Patient is a 60 y.o. female presenting with flank pain. The history is provided by the patient.  Flank Pain This is a recurrent problem. The current episode started 6 to 12 hours ago. The problem occurs constantly. The problem has not changed since onset.Pertinent negatives include no chest pain, no headaches and no shortness of breath. Nothing aggravates the symptoms. Nothing relieves the symptoms. Treatments tried: percocet. The treatment provided no relief.  Post lithotripsy with vomiting and unable to keep down her pain medication    Past Medical History  Diagnosis Date  . kidney stone   . Anxiety   . PONV (postoperative nausea and vomiting)    Past Surgical History  Procedure Laterality Date  . Abdominal hysterectomy  2005  . Lithotripsy     History reviewed. No pertinent family history. Social History  Substance Use Topics  . Smoking status: Never Smoker   . Smokeless tobacco: None  . Alcohol Use: No   OB History    No data available     Review of Systems  Respiratory: Negative for shortness of breath.   Cardiovascular: Negative for chest pain.  Gastrointestinal: Positive for nausea and vomiting.  Genitourinary: Positive for flank pain.  Neurological: Negative for headaches.  All other systems reviewed and are negative.     Allergies  Benadryl  Home Medications   Prior to Admission medications   Medication Sig Start Date End Date Taking? Authorizing Provider  ALPRAZolam Prudy Feeler) 0.25 MG tablet Take 0.25 mg by mouth as needed for anxiety.   Yes Historical Provider, MD  Ascorbic Acid (VITAMIN C) 1000 MG tablet Take 1,000 mg by mouth daily.   Yes Historical Provider, MD  aspirin 81 MG  tablet Take 81 mg by mouth daily.   Yes Historical Provider, MD  cholecalciferol (VITAMIN D) 1000 units tablet Take 1,000 Units by mouth daily.   Yes Historical Provider, MD  Coenzyme Q10 (COQ-10) 100 MG CAPS Take 100 mg by mouth daily.   Yes Historical Provider, MD  Cyanocobalamin (VITAMIN B 12 PO) Take 1,000 mg by mouth.   Yes Historical Provider, MD  Fish Oil-Cholecalciferol (FISH OIL + D3 PO) Take 2,668 mg by mouth daily.   Yes Historical Provider, MD  Multiple Vitamins-Minerals (MACULAR VITAMIN BENEFIT PO) Take 2 tablets by mouth daily.   Yes Historical Provider, MD  ondansetron (ZOFRAN) 4 MG tablet Take 1 tablet by mouth every 6 (six) hours. 04/04/15  Yes Historical Provider, MD  oxyCODONE-acetaminophen (PERCOCET/ROXICET) 5-325 MG tablet Take 1 tablet by mouth every 4 (four) hours as needed for moderate pain.  04/04/15  Yes Historical Provider, MD  ibuprofen (ADVIL,MOTRIN) 800 MG tablet Take 1 tablet (800 mg total) by mouth 3 (three) times daily. 04/05/15   Leanora Murin, MD   BP 128/68 mmHg  Pulse 64  Temp(Src) 98.1 F (36.7 C) (Oral)  Resp 18  SpO2 97% Physical Exam  Constitutional: She is oriented to person, place, and time. She appears well-developed and well-nourished. No distress.  HENT:  Head: Normocephalic and atraumatic.  Mouth/Throat: Oropharynx is clear and moist.  Eyes: Conjunctivae are normal. Pupils are equal, round, and reactive to light.  Neck: Normal range of motion. Neck supple.  Cardiovascular: Normal rate, regular rhythm and  intact distal pulses.   Pulmonary/Chest: Effort normal and breath sounds normal. No respiratory distress. She has no wheezes. She has no rales.  Abdominal: Soft. Bowel sounds are normal. There is no tenderness. There is no rebound and no guarding.  Musculoskeletal: Normal range of motion.  Neurological: She is alert and oriented to person, place, and time.  Skin: Skin is warm and dry.  Psychiatric: She has a normal mood and affect.    ED  Course  Procedures (including critical care time) Labs Review Labs Reviewed  URINALYSIS, ROUTINE W REFLEX MICROSCOPIC (NOT AT [facility]) - Abnormal; Notable for the following:    Specific Gravity, Urine 1.004 (*)    Hgb urine dipstick LARGE (*)    All other components within normal limits  URINE MICROSCOPIC-ADD ON - Abnormal; Notable for the following:    Squamous Epithelial / LPF 0-5 (*)    All other components within normal limits  I-STAT CHEM 8, ED - Abnormal; Notable for the following:    Chloride 100 (*)    Glucose, Bld 124 (*)    All other components within normal limits  CBC WITH DIFFERENTIAL/PLATELET    Imaging Review Dg Abd 1 View  04/04/2015  CLINICAL DATA:  Pre-op for left kidney stone treatment. EXAM: ABDOMEN - 1 VIEW COMPARISON:  Office radiographs 02/25/2015. FINDINGS: An approximately 11 mm calcification over the lower interpolar region of the left kidney is again noted, unchanged. No new calcifications are seen along the expected course of the ureters. There is stable small right pelvic calcification. The bowel gas pattern is normal. The bones appear unremarkable. IMPRESSION: Stable calcification over the left kidney.  No acute findings. Electronically Signed   By: Carey Bullocks M.D.   On: 04/04/2015 09:48   Dg Abd Acute W/chest  04/05/2015  CLINICAL DATA:  Patient lithotripsy on 04/04/2015. Mild discomfort at the time. When patient began to pass some of the stones pain increased. Pain continues despite medication. Nausea. Vomiting. EXAM: DG ABDOMEN ACUTE W/ 1V CHEST COMPARISON:  04/04/2015 FINDINGS: Normal heart size and pulmonary vascularity. No focal airspace disease or consolidation in the lungs. No blunting of costophrenic angles. No pneumothorax. Mediastinal contours appear intact. Multiple stone fragments demonstrated projected over the left mid kidney. Calcifications in the pelvis may represent bladder stones. No small or large bowel distention. No free intra-abdominal air.  No abnormal air-fluid levels. Visualized bones appear intact. IMPRESSION: No evidence of active pulmonary disease. Multiple stone fragments demonstrated over the left kidney and in the bladder. Normal nonobstructive bowel gas pattern. Electronically Signed   By: Burman Nieves M.D.   On: 04/05/2015 05:57   I have personally reviewed and evaluated these images and lab results as part of my medical decision-making.   EKG Interpretation None      MDM   Final diagnoses:  Post-op pain    Results for orders placed or performed during the hospital encounter of 04/05/15  CBC with Differential/Platelet  Result Value Ref Range   WBC 8.7 4.0 - 10.5 K/uL   RBC 4.59 3.87 - 5.11 MIL/uL   Hemoglobin 13.7 12.0 - 15.0 g/dL   HCT 16.1 09.6 - 04.5 %   MCV 85.4 78.0 - 100.0 fL   MCH 29.8 26.0 - 34.0 pg   MCHC 34.9 30.0 - 36.0 g/dL   RDW 40.9 81.1 - 91.4 %   Platelets 197 150 - 400 K/uL   Neutrophils Relative % 82 %   Neutro Abs 7.0 1.7 - 7.7 K/uL  Lymphocytes Relative 10 %   Lymphs Abs 0.9 0.7 - 4.0 K/uL   Monocytes Relative 8 %   Monocytes Absolute 0.7 0.1 - 1.0 K/uL   Eosinophils Relative 0 %   Eosinophils Absolute 0.0 0.0 - 0.7 K/uL   Basophils Relative 0 %   Basophils Absolute 0.0 0.0 - 0.1 K/uL  Urinalysis, Routine w reflex microscopic (not [facility])  Result Value Ref Range   Color, Urine YELLOW YELLOW   APPearance CLEAR CLEAR   Specific Gravity, Urine 1.004 (L) 1.005 - 1.030   pH 6.0 5.0 - 8.0   Glucose, UA NEGATIVE NEGATIVE mg/dL   Hgb urine dipstick LARGE (A) NEGATIVE   Bilirubin Urine NEGATIVE NEGATIVE   Ketones, ur NEGATIVE NEGATIVE mg/dL   Protein, ur NEGATIVE NEGATIVE mg/dL   Nitrite NEGATIVE NEGATIVE   Leukocytes, UA NEGATIVE NEGATIVE  Urine microscopic-add on  Result Value Ref Range   Squamous Epithelial / LPF 0-5 (A) NONE SEEN   WBC, UA NONE SEEN 0 - 5 WBC/hpf   RBC / HPF 0-5 0 - 5 RBC/hpf   Bacteria, UA NONE SEEN NONE SEEN  I-Stat Chem 8, ED  Result Value Ref  Range   Sodium 139 135 - 145 mmol/L   Potassium 4.0 3.5 - 5.1 mmol/L   Chloride 100 (L) 101 - 111 mmol/L   BUN 15 6 - 20 mg/dL   Creatinine, Ser 0.270.80 0.44 - 1.00 mg/dL   Glucose, Bld 253124 (H) 65 - 99 mg/dL   Calcium, Ion 6.641.22 4.031.12 - 1.23 mmol/L   TCO2 25 0 - 100 mmol/L   Hemoglobin 14.3 12.0 - 15.0 g/dL   HCT 47.442.0 25.936.0 - 56.346.0 %   Dg Abd 1 View  04/04/2015  CLINICAL DATA:  Pre-op for left kidney stone treatment. EXAM: ABDOMEN - 1 VIEW COMPARISON:  Office radiographs 02/25/2015. FINDINGS: An approximately 11 mm calcification over the lower interpolar region of the left kidney is again noted, unchanged. No new calcifications are seen along the expected course of the ureters. There is stable small right pelvic calcification. The bowel gas pattern is normal. The bones appear unremarkable. IMPRESSION: Stable calcification over the left kidney.  No acute findings. Electronically Signed   By: Carey BullocksWilliam  Veazey M.D.   On: 04/04/2015 09:48   Dg Abd Acute W/chest  04/05/2015  CLINICAL DATA:  Patient lithotripsy on 04/04/2015. Mild discomfort at the time. When patient began to pass some of the stones pain increased. Pain continues despite medication. Nausea. Vomiting. EXAM: DG ABDOMEN ACUTE W/ 1V CHEST COMPARISON:  04/04/2015 FINDINGS: Normal heart size and pulmonary vascularity. No focal airspace disease or consolidation in the lungs. No blunting of costophrenic angles. No pneumothorax. Mediastinal contours appear intact. Multiple stone fragments demonstrated projected over the left mid kidney. Calcifications in the pelvis may represent bladder stones. No small or large bowel distention. No free intra-abdominal air. No abnormal air-fluid levels. Visualized bones appear intact. IMPRESSION: No evidence of active pulmonary disease. Multiple stone fragments demonstrated over the left kidney and in the bladder. Normal nonobstructive bowel gas pattern. Electronically Signed   By: Burman NievesWilliam  Stevens M.D.   On: 04/05/2015  05:57    Medications  ketorolac (TORADOL) 30 MG/ML injection 30 mg (30 mg Intravenous Given 04/05/15 0530)  ondansetron (ZOFRAN) injection 4 mg (4 mg Intravenous Given 04/05/15 0530)    Case d/w Dr. Vernie Ammonsttelin ok to switch to tylenol and ibuprofen continue flomax  Feeling better post medication.  Take your zofran wait then eat a carb  laden diet and then alternate tylenol and ibuprofen.  Follow up with Urology, strict return precautions given   Maxey Ransom, MD 04/05/15 [phone]

## 2015-08-16 ENCOUNTER — Encounter (INDEPENDENT_AMBULATORY_CARE_PROVIDER_SITE_OTHER): Payer: BLUE CROSS/BLUE SHIELD | Admitting: Ophthalmology

## 2015-08-16 DIAGNOSIS — H353111 Nonexudative age-related macular degeneration, right eye, early dry stage: Secondary | ICD-10-CM

## 2015-08-16 DIAGNOSIS — H43813 Vitreous degeneration, bilateral: Secondary | ICD-10-CM | POA: Diagnosis not present

## 2015-08-16 DIAGNOSIS — H33301 Unspecified retinal break, right eye: Secondary | ICD-10-CM

## 2015-09-03 ENCOUNTER — Other Ambulatory Visit: Payer: Self-pay | Admitting: Obstetrics & Gynecology

## 2015-09-03 DIAGNOSIS — Z1231 Encounter for screening mammogram for malignant neoplasm of breast: Secondary | ICD-10-CM

## 2015-10-11 ENCOUNTER — Ambulatory Visit
Admission: RE | Admit: 2015-10-11 | Discharge: 2015-10-11 | Disposition: A | Discharge status: Home / Self Care | Payer: BLUE CROSS/BLUE SHIELD | Source: Ambulatory Visit | Attending: Obstetrics & Gynecology | Admitting: Obstetrics & Gynecology

## 2015-10-11 DIAGNOSIS — Z1231 Encounter for screening mammogram for malignant neoplasm of breast: Secondary | ICD-10-CM

## 2015-10-17 ENCOUNTER — Other Ambulatory Visit: Payer: Self-pay | Admitting: Obstetrics & Gynecology

## 2015-10-17 DIAGNOSIS — R928 Other abnormal and inconclusive findings on diagnostic imaging of breast: Secondary | ICD-10-CM

## 2015-10-21 ENCOUNTER — Ambulatory Visit
Admission: RE | Admit: 2015-10-21 | Discharge: 2015-10-21 | Disposition: A | Discharge status: Home / Self Care | Payer: BLUE CROSS/BLUE SHIELD | Source: Ambulatory Visit | Attending: Obstetrics & Gynecology | Admitting: Obstetrics & Gynecology

## 2015-10-21 DIAGNOSIS — R928 Other abnormal and inconclusive findings on diagnostic imaging of breast: Secondary | ICD-10-CM

## 2016-09-23 ENCOUNTER — Other Ambulatory Visit: Payer: Self-pay | Admitting: Obstetrics & Gynecology

## 2016-09-23 DIAGNOSIS — Z1231 Encounter for screening mammogram for malignant neoplasm of breast: Secondary | ICD-10-CM

## 2016-10-13 ENCOUNTER — Ambulatory Visit
Admission: RE | Admit: 2016-10-13 | Discharge: 2016-10-13 | Disposition: A | Discharge status: Home / Self Care | Payer: BLUE CROSS/BLUE SHIELD | Source: Ambulatory Visit | Attending: Obstetrics & Gynecology | Admitting: Obstetrics & Gynecology

## 2016-10-13 DIAGNOSIS — Z1231 Encounter for screening mammogram for malignant neoplasm of breast: Secondary | ICD-10-CM

## 2016-10-30 ENCOUNTER — Encounter: Payer: Self-pay | Admitting: Nurse Practitioner

## 2016-11-11 ENCOUNTER — Ambulatory Visit (INDEPENDENT_AMBULATORY_CARE_PROVIDER_SITE_OTHER): Payer: BLUE CROSS/BLUE SHIELD | Admitting: Nurse Practitioner

## 2016-11-11 ENCOUNTER — Encounter: Payer: Self-pay | Admitting: Nurse Practitioner

## 2016-11-11 VITALS — BP 146/68 | HR 72 | Ht 64.0 in | Wt 149.0 lb

## 2016-11-11 DIAGNOSIS — Z1211 Encounter for screening for malignant neoplasm of colon: Secondary | ICD-10-CM

## 2016-11-11 DIAGNOSIS — R109 Unspecified abdominal pain: Secondary | ICD-10-CM | POA: Diagnosis not present

## 2016-11-11 MED ORDER — OMEPRAZOLE 20 MG PO CPDR: 20.0000 mg | DELAYED_RELEASE_CAPSULE | Freq: Every day | ORAL | 3 refills | Status: AC

## 2016-11-11 NOTE — Progress Notes (Addendum)
HPI:  Patient is a 61 year old female here for evaluation of abdominal pain. She is requesting Dr. Leone PayorGessner to be her gastroenterologist. Patient saw Dr.Gupta in 2012 for evaluation of dysphagia. She underwent an EGD with findings of a normal esophagus , a wide open Schatzki's ring, and fundic gland polyps. Esophagus was dilated with 48 [place]. She was seen again in 2013 for evaluation of GERD, started on Protonix. Her last colonoscopy was done in 2005. Do not have report but according to Dr. Urban GibsonGupta's 2012 office note the colonoscopy was normal except for internal hemorrhoids.  Patient tells me that she's always had gastrointestinal problems, even as a little girl. A list of diagnostic studies in Dr. Urban GibsonGupta's 2013 office note includes CT scan of the abdomen and pelvis in 2005 and again 2012 showed no acute abnormalities. Colonoscopy in 2005 as above. HIDA scan January 2012 negative for cystic duct obstruction, ejection fraction 92%. Limited right upper quadrant ultrasound in 2012 showed a stable hepatic hemangioma measuring 5.6 cm, scattered liver cyst again were noted. Endoscopy in 2012 as above.  Patient describes an intermittent dull ache in left flank. She had a kidney stone in March 2017, sounds like she had a lithotripsy. Flank pain feels similar so patient recently saw urology who did not feel her pain was consistent with stone disease nor related to the procedure she had in March of last year.. The pain radiates around to her left back, it is worse when sitting as opposed to standing. The pain is not related to eating. She sleeps uninterrupted. A couple of weeks ago patient started omeprazole to see if it would help and her pain has started to improve . She has no nausea, vomiting, or worrisome weight loss. No rectal bleeding. For constipation she has been taking 4 stool softeners every day.   Past Medical History:  Diagnosis Date  . Anal fissure   . Anxiety   . GERD  (gastroesophageal reflux disease)   . Hyperlipidemia   . IBS (irritable bowel syndrome)   . kidney stone   . PONV (postoperative nausea and vomiting)      Past Surgical History:  Procedure Laterality Date  . ABDOMINAL HYSTERECTOMY  2005  . LITHOTRIPSY     Family History  Problem Relation Age of Onset  . Breast cancer Mother   . Colon polyps Mother   . Diabetes Mother   . Heart disease Father    Social History  Substance Use Topics  . Smoking status: Never Smoker  . Smokeless tobacco: Never Used  . Alcohol use No   Current Outpatient Prescriptions  Medication Sig Dispense Refill  . ALPRAZolam (XANAX) 0.25 MG tablet Take 0.25 mg by mouth as needed for anxiety.    Marland Kitchen. aspirin 81 MG tablet Take 81 mg by mouth daily.    . cholecalciferol (VITAMIN D) 1000 units tablet Take 3,000 Units by mouth daily.     . Coenzyme Q10 (COQ-10) 100 MG CAPS Take 100 mg by mouth daily.    . Cyanocobalamin (VITAMIN B 12 PO) Take 1,000 mg by mouth.    . Fish Oil-Cholecalciferol (FISH OIL + D3 PO) Take 2,668 mg by mouth daily.    . Multiple Vitamins-Minerals (MACULAR VITAMIN BENEFIT PO) Take 2 tablets by mouth daily.     No current facility-administered medications for this visit.    Allergies  Allergen Reactions  . Benadryl [Diphenhydramine Hcl] Other (See Comments)    Rapid heart beat and makes hyper  Review of Systems: All systems reviewed and negative except where noted in HPI.    Physical Exam: BP (!) 146/68   Pulse 72   Ht 5\' 4"  (1.626 m)   Wt 149 lb (67.6 kg)   BMI 25.58 kg/m  Constitutional:  Well-developed,white female in no acute distress. Psychiatric: Normal mood and affect. Behavior is normal. EENT: Pupils normal.  Conjunctivae are normal. No scleral icterus. Neck supple.  Cardiovascular: Normal rate, regular rhythm. No edema Pulmonary/chest: Effort normal and breath sounds normal. No wheezing, rales or rhonchi. Abdominal: Soft, nondistended. Nontender. Bowel sounds active  throughout. There are no masses palpable. No hepatomegaly. Lymphadenopathy: No cervical adenopathy noted. Neurological: Alert and oriented to person place and time. Skin: Skin is warm and dry. No rashes noted.   ASSESSMENT AND PLAN:  1. 61 yo female with longstanding hx of abdominal pain, nausea, vomiting (even as a child). Extensive workup through the years (Dr. Chales Abrahams). Nothing acute at this time. Weight stable. Abdominal exam benign.Chronic constipation manageable with stool softeners.   2. Relatively new left flank pain with radiation around to left back. Left kidney stone several months ago. Sounds like she had lithotripsy. Pain feels similar but patient saw Urology who didn't feel pain Urologic in nature. Though she is not constipated, having a bowel movement does seem to help to some degree. Patient started a PPI 2 weeks ago and pain now improving . There is also a musculoskeletal component as the pain is worse when sitting.  -Long discussion with the patient about etiologies of the left flank discomfort. I think since she is getting better it is reasonable to have her continue the PPI for a couple of more weeks then stop it and reassess her symptoms. Given the musculoskeletal component to this discomfort the improvement from PPI may be purely coincidental.  -I don't think imaging is necessary at this point but will need to entertain it if pain doesn't resolve. Would first try course of anti-inflammatory/muscle relaxers.  Patient is comfortable with this plan. She would like to be under the care of Dr. Leone Payor.   3. Colon cancer screening. Patient really does not want to have another colonoscopy, mainly because of the bowel prep. She has no family history. No rectal bleeding or other concerning symptoms. She is interested in Cologuard. I did explain that colonoscopy would still be recommended if cologuard was positive. She will think about what she wants to do.   Willette Cluster, NP  11/11/2016,  11:27 AM   Agree with Ms. Lyric Rossano's assessment and plan. Iva Boop, MD, [facility]

## 2016-11-11 NOTE — Patient Instructions (Signed)
If you are age 59 or older, your body mass index should be between 23-30. Your Body mass index is 25.58 kg/m. If this is out of the aforementioned range listed, please consider follow up with your Primary Care Provider.  If you are age 59 or younger, your body mass index should be between 19-25. Your Body mass index is 25.58 kg/m. If this is out of the aformentioned range listed, please consider follow up with your Primary Care Provider.   Take Omeprazole 20 mg every morning 30 minutes before breakfast.  You have been given literature for Cologuard and Colonoscopy.  Call with an update in two weeks.  Thank you for choosing me and [facility].   Willette ClusterPaula Guenther, NP

## 2017-09-13 ENCOUNTER — Other Ambulatory Visit: Payer: Self-pay | Admitting: Obstetrics & Gynecology

## 2017-09-13 DIAGNOSIS — Z1231 Encounter for screening mammogram for malignant neoplasm of breast: Secondary | ICD-10-CM

## 2017-10-14 ENCOUNTER — Ambulatory Visit
Admission: RE | Admit: 2017-10-14 | Discharge: 2017-10-14 | Disposition: A | Discharge status: Home / Self Care | Payer: BLUE CROSS/BLUE SHIELD | Source: Ambulatory Visit | Attending: Obstetrics & Gynecology | Admitting: Obstetrics & Gynecology

## 2017-10-14 DIAGNOSIS — Z1231 Encounter for screening mammogram for malignant neoplasm of breast: Secondary | ICD-10-CM

## 2018-01-15 IMAGING — MG 2D DIGITAL SCREENING BILATERAL MAMMOGRAM WITH CAD AND ADJUNCT TO
9 of 12 series · 9 of 28 positions shown · non-contrast
Comparison: Previous exam(s).

CLINICAL DATA: Screening.

EXAM:
2D DIGITAL SCREENING BILATERAL MAMMOGRAM WITH CAD AND ADJUNCT TOMO

[L CC]
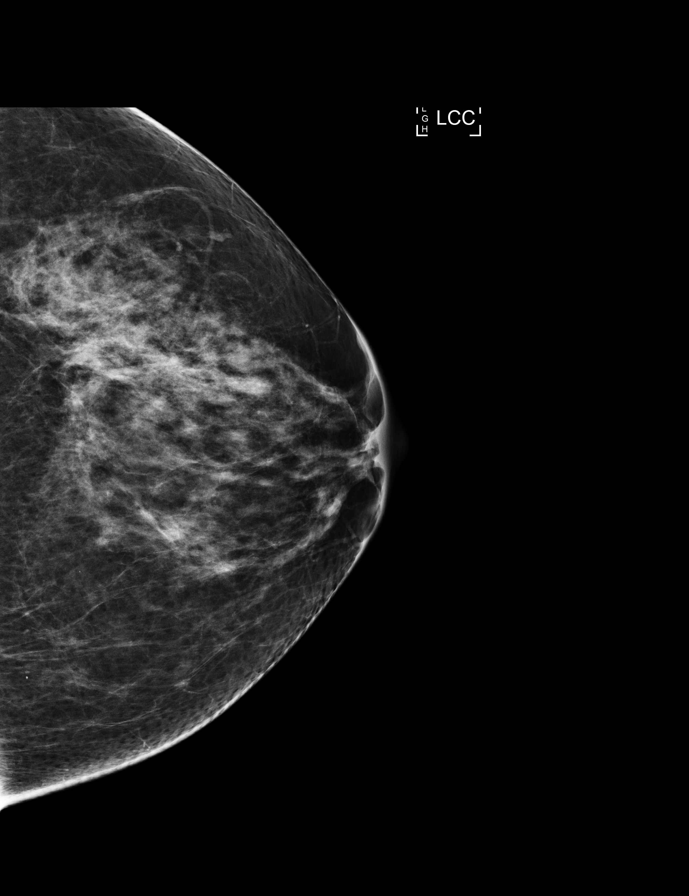

[R MLO synth-2D]
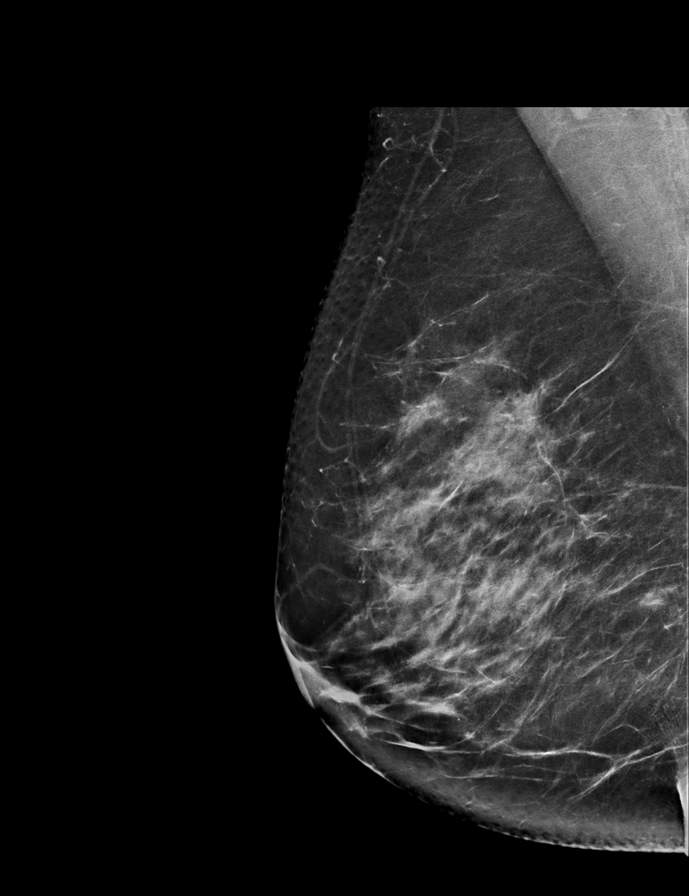

[L MLO]
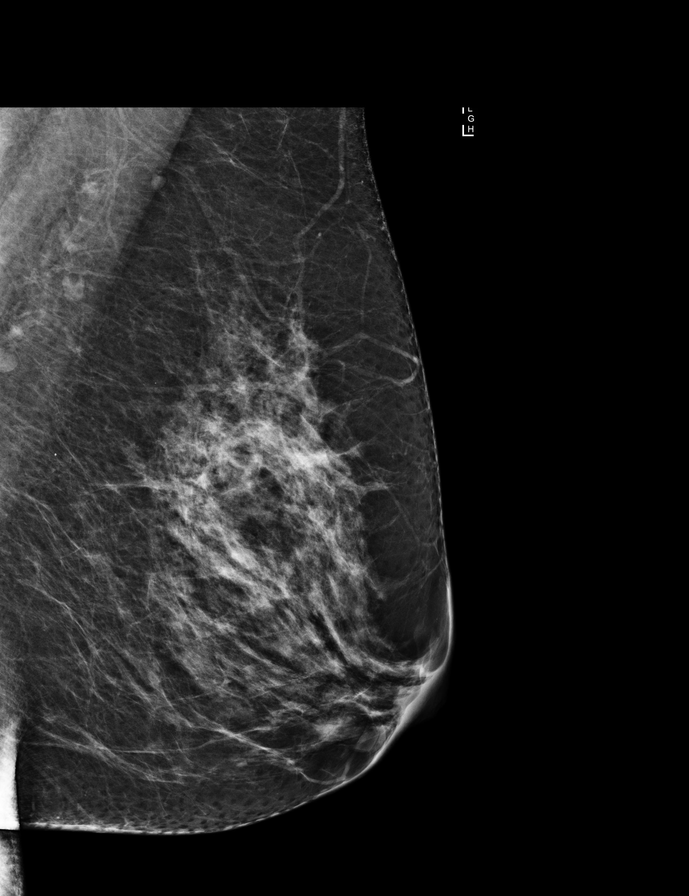

[R MLO]
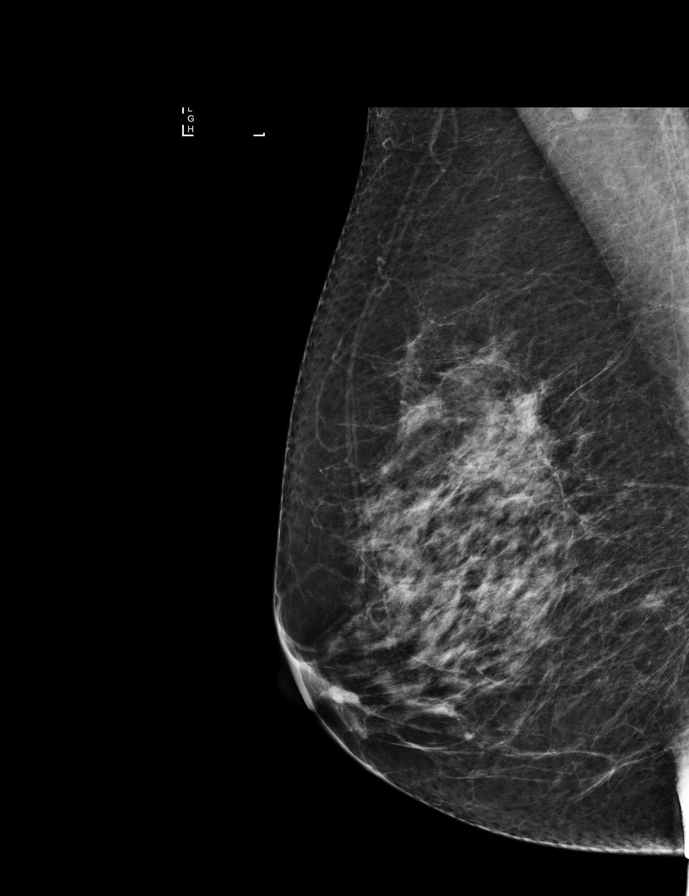

[L CC synth-2D]
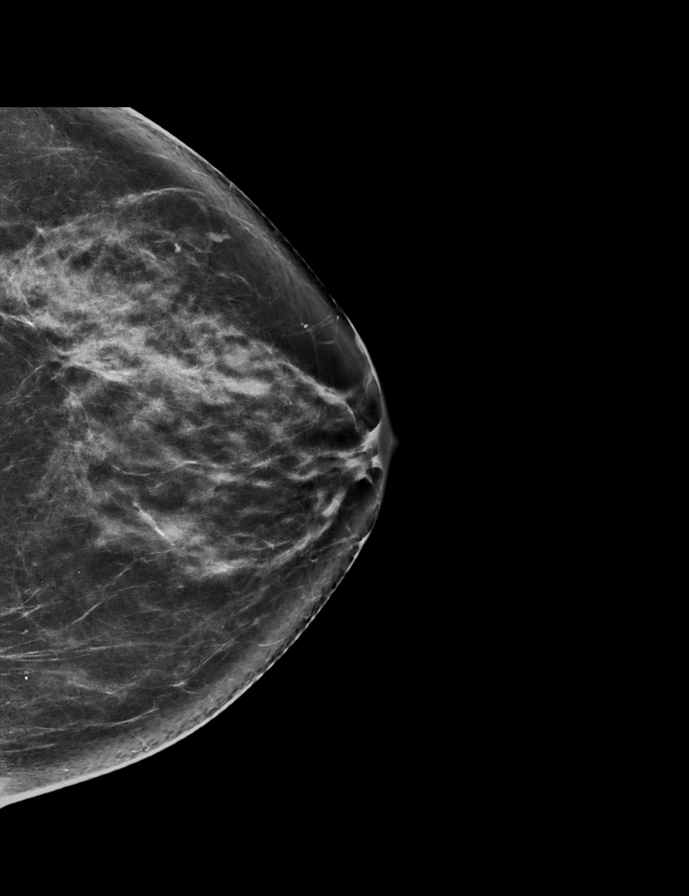

[R CC synth-2D]
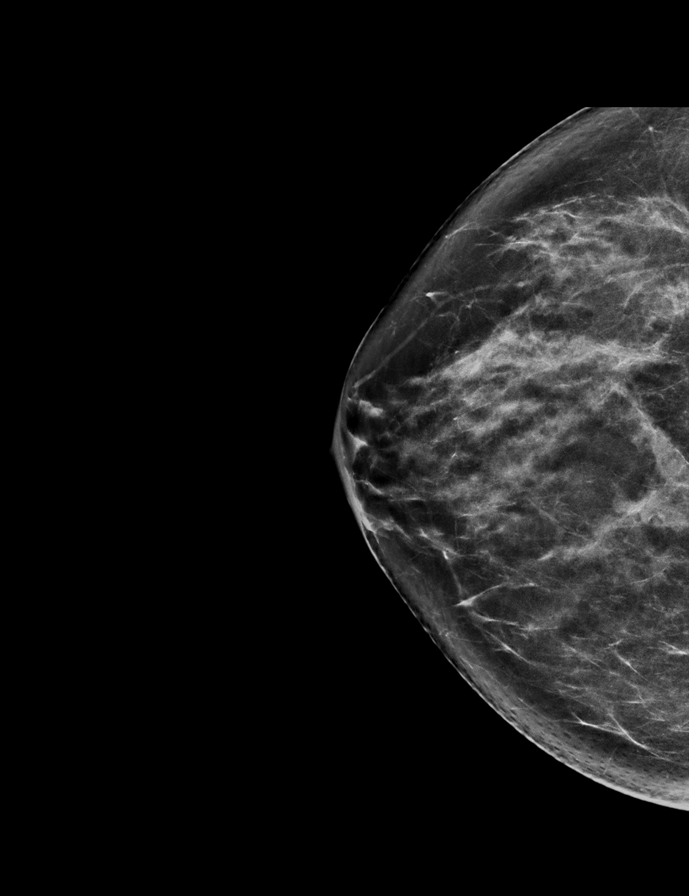

[L MLO synth-2D]
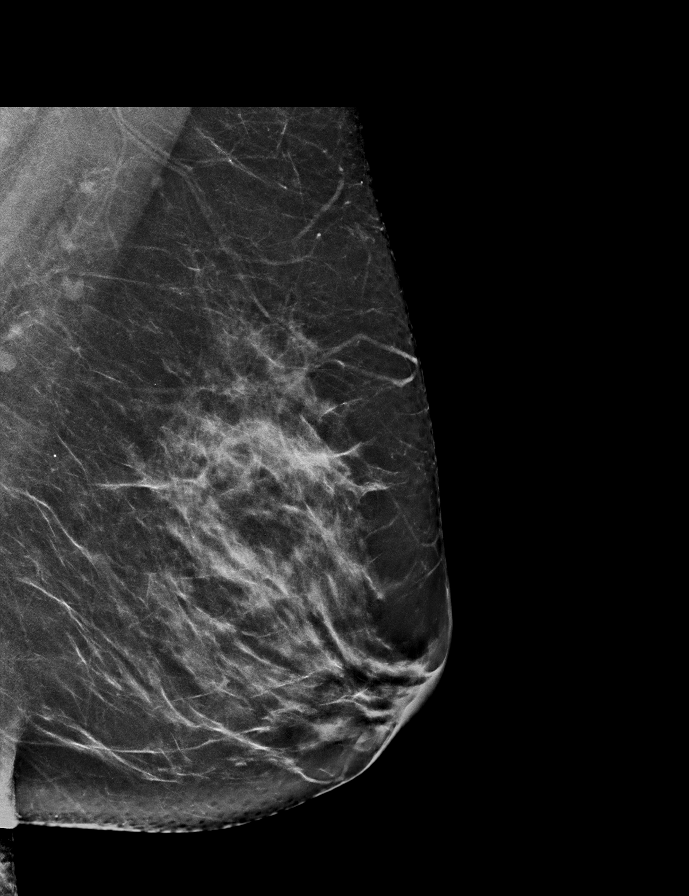

[R CC]
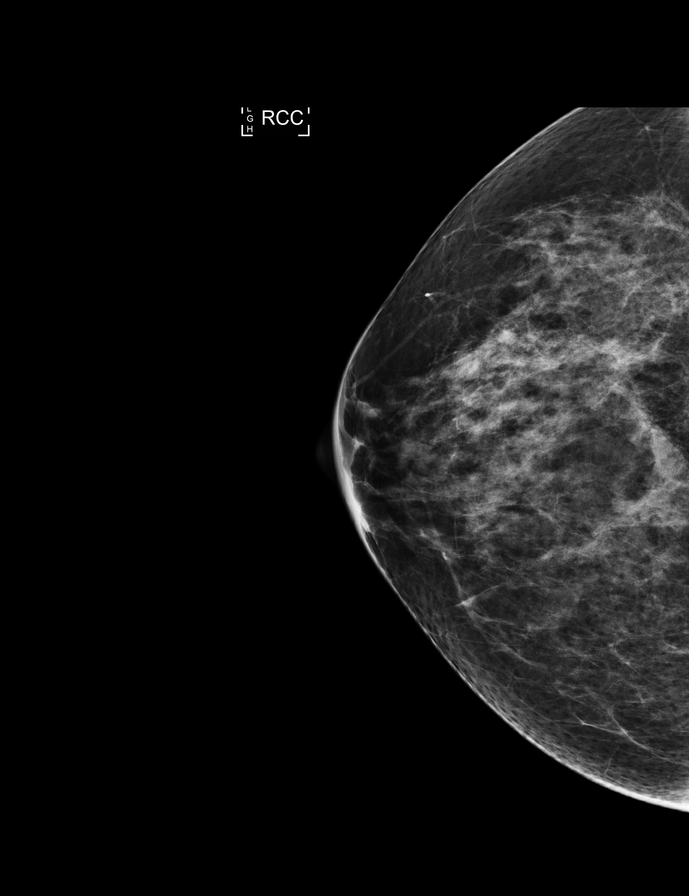

[R MLO tomo · tomo slice 40/79.0]
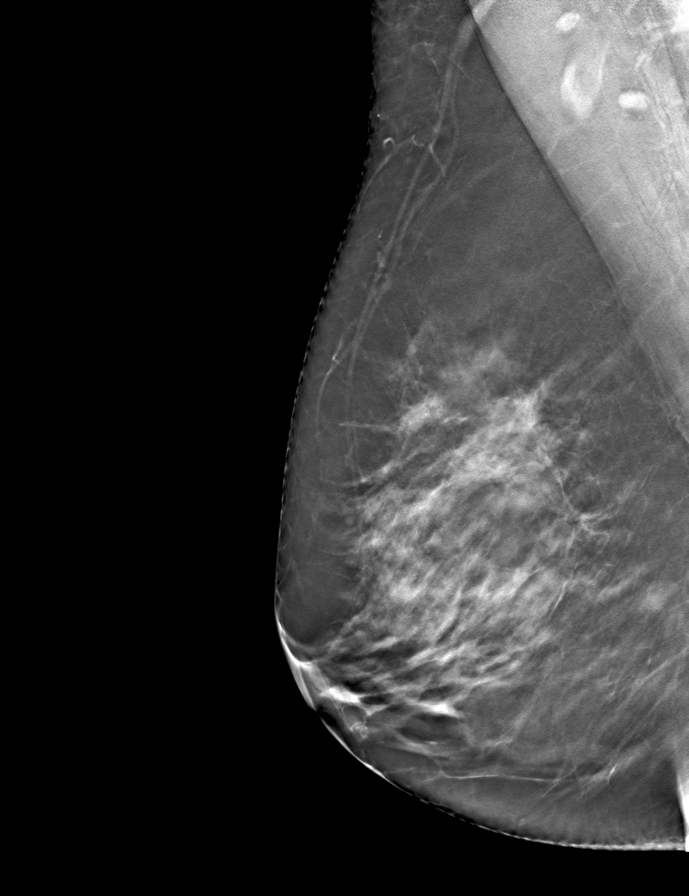

[9 of 28 positions shown; findings below may reference images not displayed]

ACR Breast Density Category c: The breast tissue is heterogeneously
dense, which may obscure small masses.
FINDINGS: In the right breast, a possible asymmetry warrants further
evaluation. In the left breast, no findings suspicious for
malignancy. Images were processed with CAD.
IMPRESSION: Further evaluation is suggested for possible asymmetry in the right
breast.

RECOMMENDATION:
Diagnostic mammogram and possibly ultrasound of the right breast.
(Code:QT-[id number])

The patient will be contacted regarding the findings, and additional
imaging will be scheduled.

BI-RADS CATEGORY  0: Incomplete. Need additional imaging evaluation
and/or prior mammograms for comparison.

## 2019-01-27 HISTORY — PX: BREAST BIOPSY: SHX20

## 2021-01-08 ENCOUNTER — Other Ambulatory Visit: Payer: Self-pay | Admitting: Internal Medicine

## 2021-01-08 DIAGNOSIS — Z1231 Encounter for screening mammogram for malignant neoplasm of breast: Secondary | ICD-10-CM

## 2021-02-11 ENCOUNTER — Ambulatory Visit
Admission: RE | Admit: 2021-02-11 | Discharge: 2021-02-11 | Disposition: A | Discharge status: Home / Self Care | Payer: BC Managed Care – PPO | Source: Ambulatory Visit | Attending: Internal Medicine | Admitting: Internal Medicine

## 2021-02-11 DIAGNOSIS — Z1231 Encounter for screening mammogram for malignant neoplasm of breast: Secondary | ICD-10-CM

## 2022-01-28 ENCOUNTER — Other Ambulatory Visit: Payer: Self-pay | Admitting: Internal Medicine

## 2022-01-28 DIAGNOSIS — Z1231 Encounter for screening mammogram for malignant neoplasm of breast: Secondary | ICD-10-CM

## 2022-03-18 ENCOUNTER — Ambulatory Visit: Payer: BC Managed Care – PPO

## 2022-03-26 ENCOUNTER — Ambulatory Visit
Admission: RE | Admit: 2022-03-26 | Discharge: 2022-03-26 | Disposition: A | Discharge status: Home / Self Care | Payer: Medicare Other | Source: Ambulatory Visit | Attending: Internal Medicine | Admitting: Internal Medicine

## 2022-03-26 DIAGNOSIS — Z1231 Encounter for screening mammogram for malignant neoplasm of breast: Secondary | ICD-10-CM

## 2022-04-01 ENCOUNTER — Other Ambulatory Visit: Payer: Self-pay | Admitting: Internal Medicine

## 2022-04-01 DIAGNOSIS — N6320 Unspecified lump in the left breast, unspecified quadrant: Secondary | ICD-10-CM

## 2022-04-11 ENCOUNTER — Other Ambulatory Visit: Payer: Self-pay | Admitting: Internal Medicine

## 2022-04-11 ENCOUNTER — Ambulatory Visit
Admission: RE | Admit: 2022-04-11 | Discharge: 2022-04-11 | Disposition: A | Discharge status: Home / Self Care | Payer: Medicare Other | Source: Ambulatory Visit | Attending: Internal Medicine | Admitting: Internal Medicine

## 2022-04-11 ENCOUNTER — Ambulatory Visit: Payer: Medicare Other

## 2022-04-11 DIAGNOSIS — N6320 Unspecified lump in the left breast, unspecified quadrant: Secondary | ICD-10-CM

## 2023-02-05 ENCOUNTER — Ambulatory Visit
Admission: RE | Admit: 2023-02-05 | Discharge: 2023-02-05 | Disposition: A | Discharge status: Home / Self Care | Payer: Medicare Other | Source: Ambulatory Visit | Attending: Internal Medicine | Admitting: Internal Medicine

## 2023-02-05 DIAGNOSIS — N6320 Unspecified lump in the left breast, unspecified quadrant: Secondary | ICD-10-CM
